# Patient Record
Sex: Female | Born: 1990 | Race: White | Hispanic: No | Marital: Single | Smoking: Never smoker
Health system: Southern US, Community
[De-identification: ages and names within clinical notes are randomized; demographics above are authoritative.]

## PROBLEM LIST (undated history)

## (undated) DIAGNOSIS — A749 Chlamydial infection, unspecified: Secondary | ICD-10-CM

## (undated) HISTORY — DX: Chlamydial infection, unspecified: A74.9

---
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
## 2010-02-27 ENCOUNTER — Emergency Department (HOSPITAL_COMMUNITY): Admission: EM | Admit: 2010-02-27 | Discharge: 2010-02-27 | Payer: Self-pay | Admitting: Emergency Medicine

## 2011-01-24 ENCOUNTER — Inpatient Hospital Stay (INDEPENDENT_AMBULATORY_CARE_PROVIDER_SITE_OTHER)
Admission: RE | Admit: 2011-01-24 | Discharge: 2011-01-24 | Disposition: A | Discharge status: Home / Self Care | Payer: BC Managed Care – PPO | Source: Ambulatory Visit | Attending: Family Medicine | Admitting: Family Medicine

## 2011-01-24 ENCOUNTER — Ambulatory Visit (INDEPENDENT_AMBULATORY_CARE_PROVIDER_SITE_OTHER): Payer: BC Managed Care – PPO

## 2011-01-24 DIAGNOSIS — M25579 Pain in unspecified ankle and joints of unspecified foot: Secondary | ICD-10-CM

## 2011-01-24 DIAGNOSIS — H6980 Other specified disorders of Eustachian tube, unspecified ear: Secondary | ICD-10-CM

## 2012-08-02 IMAGING — CR DG ANKLE COMPLETE 3+V*L*
3 series · 3 of 3 positions shown · non-contrast
Comparison: None.

CLINICAL DATA: Pain after jumping

LEFT ANKLE COMPLETE - 3+ VIEW

[view not recorded (1 of 3)]
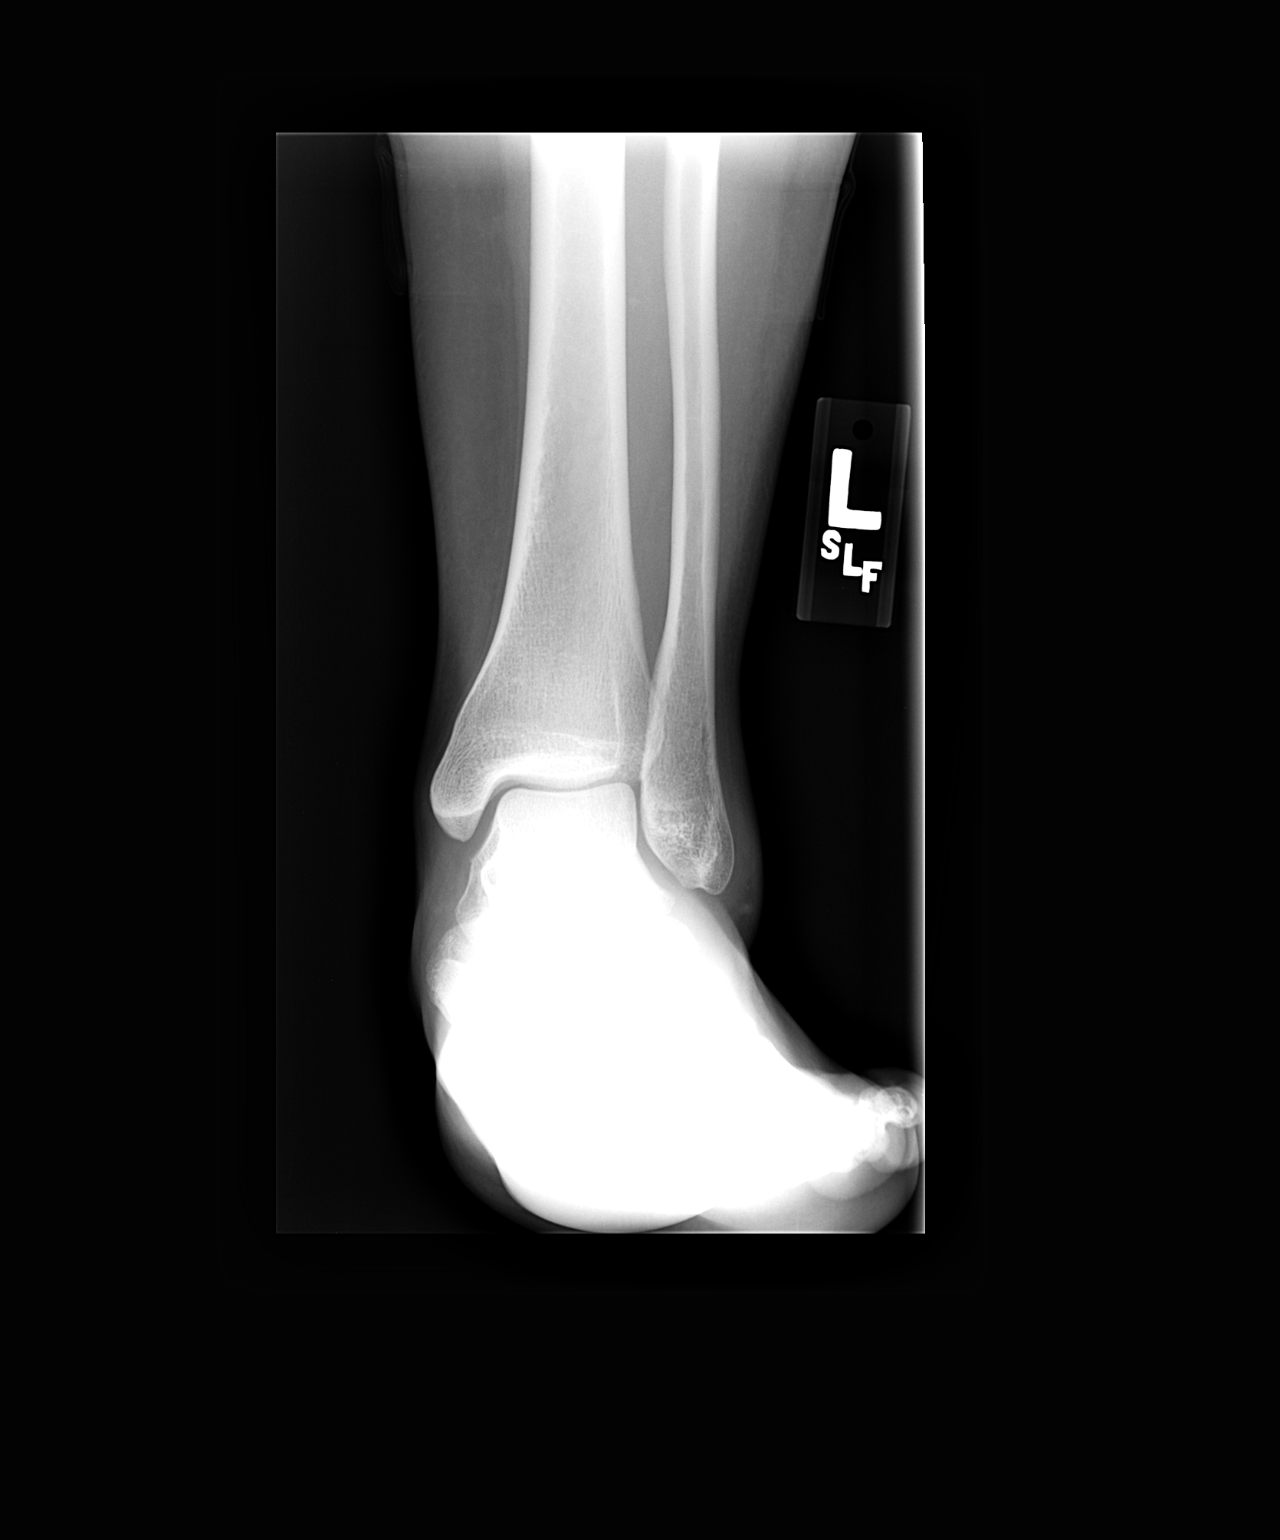

[view not recorded (2 of 3)]
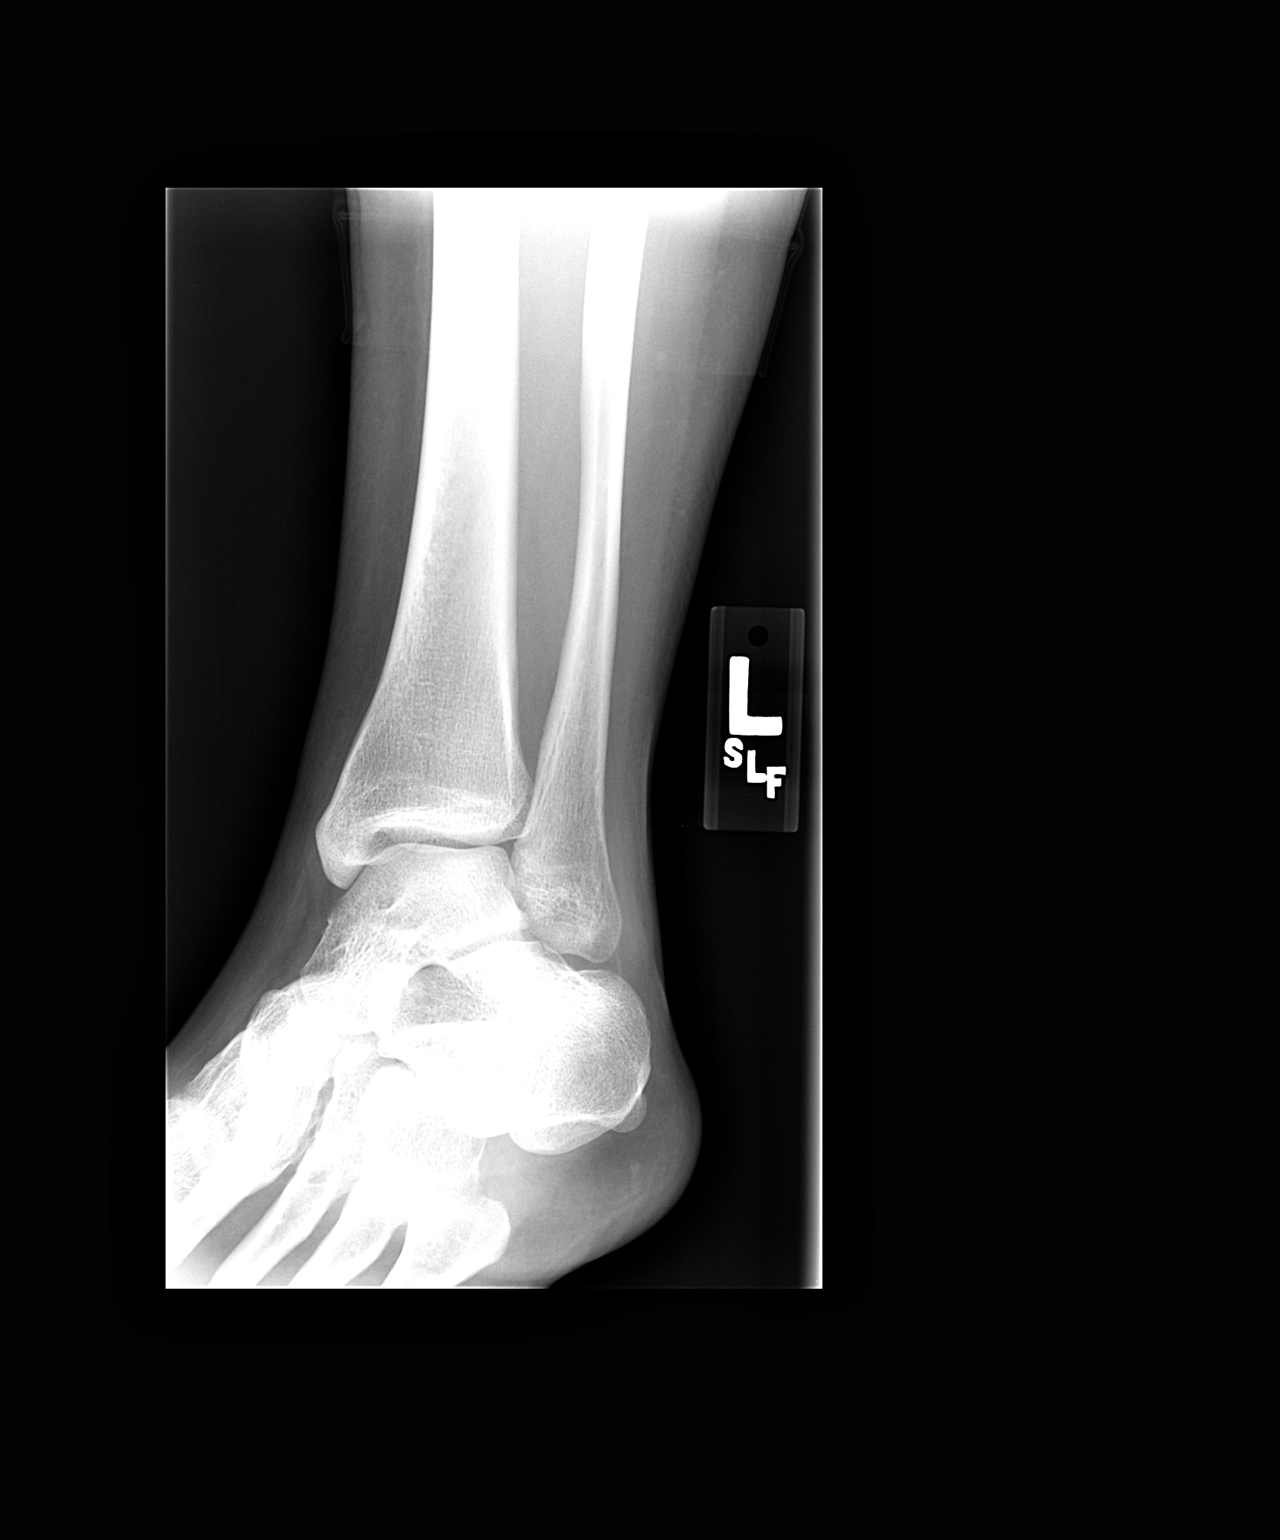

[view not recorded (3 of 3)]
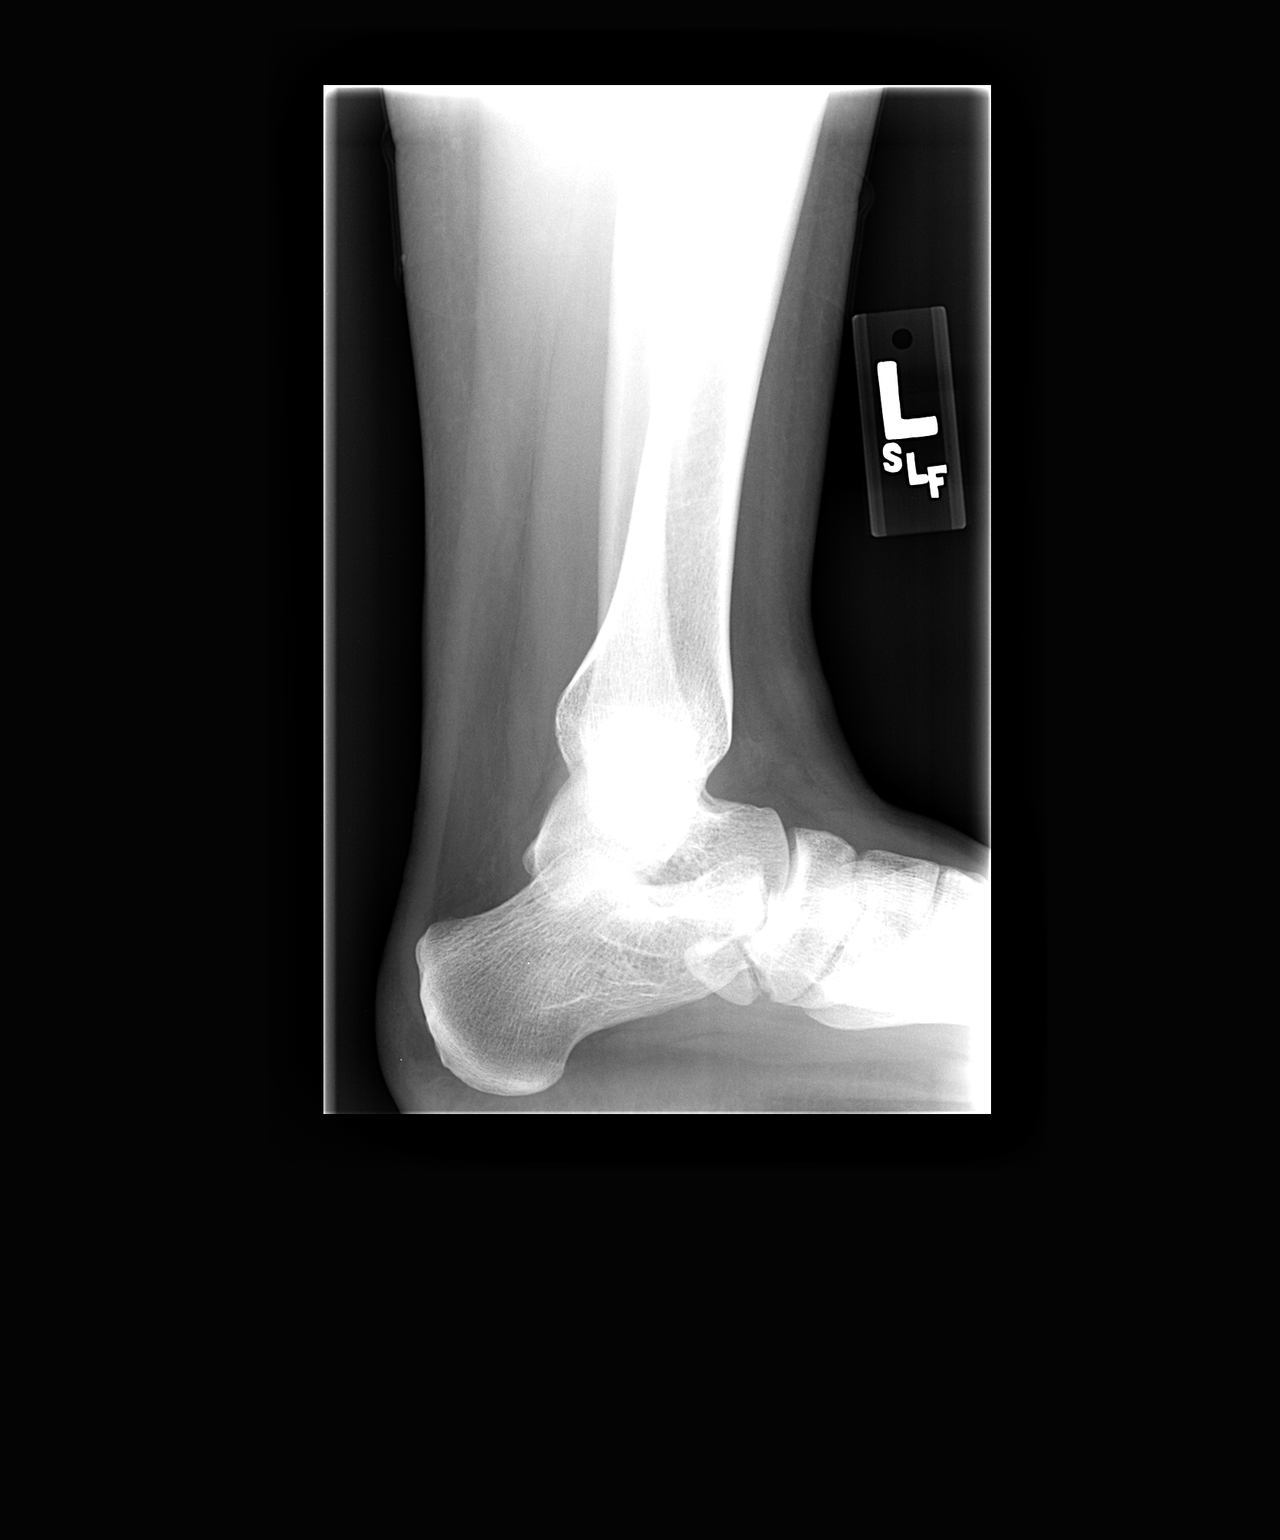

[3 of 3 positions shown; findings below may reference images not displayed]

FINDINGS: Ankle mortise intact. Negative for fracture, dislocation,
or other acute abnormality.  Normal alignment and mineralization.
No significant degenerative change.  Regional soft tissues
unremarkable.
IMPRESSION: Negative

## 2016-04-12 ENCOUNTER — Ambulatory Visit (INDEPENDENT_AMBULATORY_CARE_PROVIDER_SITE_OTHER): Payer: BLUE CROSS/BLUE SHIELD | Admitting: Gynecology

## 2016-04-12 ENCOUNTER — Ambulatory Visit: Payer: Self-pay | Admitting: Gynecology

## 2016-04-12 ENCOUNTER — Encounter: Payer: Self-pay | Admitting: Gynecology

## 2016-04-12 VITALS — BP 120/76

## 2016-04-12 DIAGNOSIS — Z124 Encounter for screening for malignant neoplasm of cervix: Secondary | ICD-10-CM

## 2016-04-12 DIAGNOSIS — Z832 Family history of diseases of the blood and blood-forming organs and certain disorders involving the immune mechanism: Secondary | ICD-10-CM

## 2016-04-12 DIAGNOSIS — Z3009 Encounter for other general counseling and advice on contraception: Secondary | ICD-10-CM | POA: Diagnosis not present

## 2016-04-12 DIAGNOSIS — L731 Pseudofolliculitis barbae: Secondary | ICD-10-CM | POA: Diagnosis not present

## 2016-04-12 DIAGNOSIS — Z113 Encounter for screening for infections with a predominantly sexual mode of transmission: Secondary | ICD-10-CM | POA: Diagnosis not present

## 2016-04-12 DIAGNOSIS — L739 Follicular disorder, unspecified: Secondary | ICD-10-CM | POA: Insufficient documentation

## 2016-04-12 LAB — PROTIME-INR
INR: 0.93 (ref <1.50)
Prothrombin Time: 12.6 seconds (ref 11.6–15.2)

## 2016-04-12 LAB — APTT: APTT: 28 s (ref 24–37)

## 2016-04-12 LAB — HIV ANTIBODY (ROUTINE TESTING W REFLEX): HIV: NONREACTIVE

## 2016-04-12 MED ORDER — ETONOGESTREL-ETHINYL ESTRADIOL 0.12-0.015 MG/24HR VA RING: VAGINAL_RING | VAGINAL | Status: DC

## 2016-04-12 MED ORDER — DOXYCYCLINE HYCLATE 100 MG PO CAPS: 100.0000 mg | ORAL_CAPSULE | Freq: Two times a day (BID) | ORAL | Status: DC

## 2016-04-12 MED ORDER — DROSPIRENONE-ETHINYL ESTRADIOL 3-0.03 MG PO TABS: 1.0000 | ORAL_TABLET | Freq: Every day | ORAL | Status: AC

## 2016-04-12 MED ORDER — MUPIROCIN 2 % EX OINT: 1.0000 "application " | TOPICAL_OINTMENT | Freq: Three times a day (TID) | CUTANEOUS | Status: AC

## 2016-04-12 NOTE — Progress Notes (Signed)
HPI: Patient is a 25 year old gravida 0 who was referred to her practice as a courtesy of Tomi Bamberger NP in reference to a vulvar lesion the patient notices for the past 2 weeks. Patient has been with the same sexual partner for 2 years. She has been using condoms for contraception but only used the NuvaRing at one time for one month only. She was interested in discussing as well other contraceptive options. She did mention that her sister in her mid 18s was diagnosed with some form of blood clot in her lower extremity but is not clear on the details. Patient stated her last Pap smear was over 5 years ago.   ROS: A ROS was performed and pertinent positives and negatives are included in the history.  GENERAL: No fevers or chills. HEENT: No change in vision, no earache, sore throat or sinus congestion. NECK: No pain or stiffness. CARDIOVASCULAR: No chest pain or pressure. No palpitations. PULMONARY: No shortness of breath, cough or wheeze. GASTROINTESTINAL: No abdominal pain, nausea, vomiting or diarrhea, melena or bright red blood per rectum. GENITOURINARY: No urinary frequency, urgency, hesitancy or dysuria. MUSCULOSKELETAL: No joint or muscle pain, no back pain, no recent trauma. DERMATOLOGIC: No rash, no itching, no lesions. ENDOCRINE: No polyuria, polydipsia, no heat or cold intolerance. No recent change in weight. HEMATOLOGICAL: No anemia or easy bruising or bleeding. NEUROLOGIC: No headache, seizures, numbness, tingling or weakness. PSYCHIATRIC: No depression, no loss of interest in normal activity or change in sleep pattern.   ZO:XWRU developed well nourished female with the above complaint. Physical Exam  Genitourinary:     This area was highly suspicious for a draining sebaceous cysts. Culture was obtained. Also a GC and Chlamydia culture was obtained as well from the cervix.    Assessment Plan: Weight appears to be a small follicular abscess starting to drain. She will be started on  Vibramycin 100 mg one by mouth twice a day for 2 weeks to cover for possible MRSA and she will apply Bactroban to the area 2-3 times a day as well as use antibacterial soap. If it does not resolve in 2 weeks she'll return back to the office we will need to consider doing an incision and drainage or if he gets bigger or more painful. Patient was interested in going on the oral contraceptive pill not only to regulate her cycle but for her acne but when she brought up the history of her sister with what sounds to be a DVT I would like to run a thrombophilia panel to rule out any clotting disorders before starting her oral contraceptive pill. The following labs were ordered: PT, PTT, fibrinogen, anti-thrombin 3, total protein C, total protein S, lupus anticoagulant panel, and Leiden 5 factor, cardiolipin antibody and prothrombin gene mutation. Also as part of her STD screening in addition to the [facility] and Chlamydia culture she will stop by the lab we'll check an HIV as well. If all these results are normal she is going to be started on Ocela oral contraceptive pill. The risks benefits and pros and cons were discussed and literature information was provided. She is going to call back in a week for the results. She is not due for her next menstrual cycle for 3 weeks from now. We also did a Pap smear since it been 5 years since her last Pap smear.    Greater than 50% of time was spent in counseling and coordinating care of this patient.   Time of  consultation: 30   Minutes.

## 2016-04-12 NOTE — Patient Instructions (Addendum)
Drospirenone; Ethinyl Estradiol tablets What is this medicine? DROSPIRENONE; ETHINYL ESTRADIOL (dro SPY re nown; ETH in il es tra DYE ole) is an oral contraceptive (birth control pill). This medicine combines two types of female hormones, an estrogen and a progestin. It is used to prevent ovulation and pregnancy. This medicine may be used for other purposes; ask your health care provider or pharmacist if you have questions. What should I tell my health care provider before I take this medicine? They need to know if you have or ever had any of these conditions: -abnormal vaginal bleeding -adrenal gland disease -blood vessel disease or blood clots -breast, cervical, endometrial, ovarian, liver, or uterine cancer -diabetes -gallbladder disease -heart disease or recent heart attack -high blood pressure -high cholesterol -high potassium level -kidney disease -liver disease -migraine headaches -stroke -systemic lupus erythematosus (SLE) -tobacco smoker -an unusual or allergic reaction to estrogens, progestins, or other medicines, foods, dyes, or preservatives -pregnant or trying to get pregnant -breast-feeding How should I use this medicine? Take this medicine by mouth. To reduce nausea, this medicine may be taken with food. Follow the directions on the prescription label. Take this medicine at the same time each day and in the order directed on the package. Do not take your medicine more often than directed. A patient package insert for the product will be given with each prescription and refill. Read this sheet carefully each time. The sheet may change frequently. Talk to your pediatrician regarding the use of this medicine in children. Special care may be needed. This medicine has been used in female children who have started having menstrual periods. Overdosage: If you think you have taken too much of this medicine contact a poison control center or emergency room at once. NOTE: This  medicine is only for you. Do not share this medicine with others. What if I miss a dose? If you miss a dose, refer to the patient information sheet you received with your medicine for direction. If you miss more than one pill, this medicine may not be as effective and you may need to use another form of birth control. What may interact with this medicine? -acetaminophen -antibiotics or medicines for infections, especially rifampin, rifabutin, rifapentine, and griseofulvin, and possibly penicillins or tetracyclines -aprepitant -ascorbic acid (vitamin C) -atorvastatin -barbiturate medicines, such as phenobarbital -bosentan -carbamazepine -caffeine -clofibrate -cyclosporine -dantrolene -doxercalciferol -felbamate -grapefruit juice -hydrocortisone -medicines for anxiety or sleeping problems, such as diazepam or temazepam -medicines for diabetes, including pioglitazone -mineral oil -modafinil -mycophenolate -nefazodone -oxcarbazepine -phenytoin -prednisolone -ritonavir or other medicines for HIV infection or AIDS -rosuvastatin -selegiline -soy isoflavones supplements -St. John's wort -tamoxifen or raloxifene -theophylline -thyroid hormones -topiramate -warfarin This product is different from other birth control pills because it contains the progestin drospirenone. Drospirenone may increase potassium levels. Interactions with other drugs may increase the chance of an elevated potassium level. You may need blood tests to check your potassium level. Drugs that can increase the potassium level include: -certain medications for high blood pressure or heart conditions (examples include ACE-inhibitors and also Angiotensin-II receptor blockers, and Eplerenone -dietary salt substitutes (these may contain potassium) -heparin -NSAIDs (antiinflammatory drugs), if they are taken long-term and daily, like for arthritis -potassium supplements -some 'water pills' (diuretics like amiloride,  spironolactone or triamterene) This list may not describe all possible interactions. Give your health care provider a list of all the medicines, herbs, non-prescription drugs, or dietary supplements you use. Also tell them if you smoke, drink alcohol, or use illegal   drugs. Some items may interact with your medicine. What should I watch for while using this medicine? Visit your doctor or health care professional for regular checks on your progress. You will need a regular breast and pelvic exam and Pap smear while on this medicine. Use an additional method of contraception during the first cycle that you take these tablets. If you have any reason to think you are pregnant, stop taking this medicine right away and contact your doctor or health care professional. If you are taking this medicine for hormone related problems, it may take several cycles of use to see improvement in your condition. Smoking increases the risk of getting a blood clot or having a stroke while you are taking birth control pills, especially if you are more than 26 years old. You are strongly advised not to smoke. This medicine can make your body retain fluid, making your fingers, hands, or ankles swell. Your blood pressure can go up. Contact your doctor or health care professional if you feel you are retaining fluid. This medicine can make you more sensitive to the sun. Keep out of the sun. If you cannot avoid being in the sun, wear protective clothing and use sunscreen. Do not use sun lamps or tanning beds/booths. If you wear contact lenses and notice visual changes, or if the lenses begin to feel uncomfortable, consult your eye care specialist. In some women, tenderness, swelling, or minor bleeding of the gums may occur. Notify your dentist if this happens. Brushing and flossing your teeth regularly may help limit this. See your dentist regularly and inform your dentist of the medicines you are taking. If you are going to have  elective surgery, you may need to stop taking this medicine before the surgery. Consult your health care professional for advice. This medicine does not protect you against HIV infection (AIDS) or any other sexually transmitted diseases. What side effects may I notice from receiving this medicine? Side effects that you should report to your doctor or health care professional as soon as possible: -allergic reactions like skin rash, itching or hives, swelling of the face, lips, or tongue -breast tissue changes or discharge -changes in vision -chest pain -confusion, trouble speaking or understanding -dark urine -general ill feeling or flu-like symptoms -light-colored stools -nausea, vomiting -pain, swelling, warmth in the leg -right upper belly pain -severe headaches -shortness of breath -sudden numbness or weakness of the face, arm or leg -trouble walking, dizziness, loss of balance or coordination -unusual vaginal bleeding -yellowing of the eyes or skin Side effects that usually do not require medical attention (report to your doctor or health care professional if they continue or are bothersome): -acne -brown spots on the face -change in appetite -change in sexual desire -depressed mood or mood swings -fluid retention and swelling -stomach cramps or bloating -unusually weak or tired -weight gain This list may not describe all possible side effects. Call your doctor for medical advice about side effects. You may report side effects to FDA at 1-800-FDA-1088. Where should I keep my medicine? Keep out of the reach of children. Store at room temperature between 15 and 30 degrees C (59 and 86 degrees F). Throw away any unused medicine after the expiration date. NOTE: This sheet is a summary. It may not cover all possible information. If you have questions about this medicine, talk to your doctor, pharmacist, or health care provider.    2016, Elsevier/Gold Standard. (2008-11-03  13:02:54)  Folliculitis Folliculitis is redness, soreness, and swelling (  inflammation) of the hair follicles. This condition can occur anywhere on the body. People with weakened immune systems, diabetes, or obesity have a greater risk of getting folliculitis. CAUSES  Bacterial infection. This is the most common cause.  Fungal infection.  Viral infection.  Contact with certain chemicals, especially oils and tars. Long-term folliculitis can result from bacteria that live in the nostrils. The bacteria may trigger multiple outbreaks of folliculitis over time. SYMPTOMS Folliculitis most commonly occurs on the scalp, thighs, legs, back, buttocks, and areas where hair is shaved frequently. An early sign of folliculitis is a small, white or yellow, pus-filled, itchy lesion (pustule). These lesions appear on a red, inflamed follicle. They are usually less than 0.2 inches (5 mm) wide. When there is an infection of the follicle that goes deeper, it becomes a boil or furuncle. A group of closely packed boils creates a larger lesion (carbuncle). Carbuncles tend to occur in hairy, sweaty areas of the body. DIAGNOSIS  Your caregiver can usually tell what is wrong by doing a physical exam. A sample may be taken from one of the lesions and tested in a lab. This can help determine what is causing your folliculitis. TREATMENT  Treatment may include:  Applying warm compresses to the affected areas.  Taking antibiotic medicines orally or applying them to the skin.  Draining the lesions if they contain a large amount of pus or fluid.  Laser hair removal for cases of long-lasting folliculitis. This helps to prevent regrowth of the hair. HOME CARE INSTRUCTIONS  Apply warm compresses to the affected areas as directed by your caregiver.  If antibiotics are prescribed, take them as directed. Finish them even if you start to feel better.  You may take over-the-counter medicines to relieve itching.  Do not  shave irritated skin.  Follow up with your caregiver as directed. SEEK IMMEDIATE MEDICAL CARE IF:   You have increasing redness, swelling, or pain in the affected area.  You have a fever. MAKE SURE YOU:  Understand these instructions.  Will watch your condition.  Will get help right away if you are not doing well or get worse.   This information is not intended to replace advice given to you by your health care provider. Make sure you discuss any questions you have with your health care provider.   Document Released: 01/27/2002 Document Revised: 12/09/2014 Document Reviewed: 02/18/2012 Elsevier Interactive Patient Education [employer]2016 Elsevier Inc.

## 2016-04-13 LAB — FIBRINOGEN: FIBRINOGEN: 208 mg/dL (ref 204–475)

## 2016-04-13 LAB — GC/CHLAMYDIA PROBE AMP
CT Probe RNA: DETECTED — AB
GC Probe RNA: NOT DETECTED

## 2016-04-15 ENCOUNTER — Encounter: Payer: Self-pay | Admitting: Gynecology

## 2016-04-15 LAB — PAP IG W/ RFLX HPV ASCU

## 2016-04-15 LAB — WOUND CULTURE
GRAM STAIN: NONE SEEN
GRAM STAIN: NONE SEEN
Gram Stain: NONE SEEN

## 2016-04-16 LAB — CARDIOLIPIN ANTIBODY: PHOSPHOLIPIDS: 232 mg/dL (ref 151–264)

## 2016-04-17 ENCOUNTER — Telehealth: Payer: Self-pay | Admitting: *Deleted

## 2016-04-17 LAB — RFX DRVVT SCR W/RFLX CONF 1:1 MIX: dRVVT Screen: 36 s (ref <=45)

## 2016-04-17 LAB — PROTHROMBIN GENE MUTATION

## 2016-04-17 LAB — FACTOR 5 LEIDEN

## 2016-04-17 LAB — RFX PTT-LA W/RFX TO HEX PHASE CONF: PTT-LA SCREEN: 42 s — AB (ref <=40)

## 2016-04-17 LAB — RFLX HEXAGONAL PHASE CONFIRM: Hexagonal Phase Confirm: NEGATIVE

## 2016-04-17 LAB — LUPUS ANTICOAGULANT PANEL

## 2016-04-17 NOTE — Telephone Encounter (Signed)
Dr.Fernandez pt had positive Chlamydia results 04/15/16, told to take Vibramycin 100 mg one by mouth twice a day for 7 days, you send Rx to pharmacy for this same Rx on OV 04/12/16. I just want to confirm that pt doesn't need another Rx? And this one will take care of this.  Please advise

## 2016-04-17 NOTE — Telephone Encounter (Signed)
She was put on Vibramycin the other day for 2 weeks for potential MRSA so she does not need any additional antibiotic this will cover for her chlamydia

## 2016-04-18 NOTE — Telephone Encounter (Signed)
This was explained to pt yesterday, I just wanted to confirm this with provider

## 2016-04-19 LAB — ANTITHROMBIN III: AntiThromb III Func: 103 % activity (ref 80–120)

## 2016-04-19 LAB — PROTEIN C, TOTAL: Protein C Antigen: 103 % (ref 70–140)

## 2016-04-19 LAB — PROTEIN S, TOTAL: PROTEIN S ANTIGEN, TOTAL: 96 % (ref 70–140)

## 2016-05-02 ENCOUNTER — Ambulatory Visit: Payer: Self-pay | Admitting: Gynecology

## 2017-02-11 ENCOUNTER — Ambulatory Visit: Payer: Self-pay | Admitting: Gynecology

## 2017-02-11 DIAGNOSIS — Z0289 Encounter for other administrative examinations: Secondary | ICD-10-CM

## 2017-04-16 ENCOUNTER — Encounter: Payer: Self-pay | Admitting: Gynecology

## 2019-10-25 ENCOUNTER — Other Ambulatory Visit: Payer: Self-pay

## 2019-10-25 ENCOUNTER — Encounter (HOSPITAL_COMMUNITY): Payer: Self-pay

## 2019-10-25 ENCOUNTER — Ambulatory Visit (HOSPITAL_COMMUNITY)
Admission: EM | Admit: 2019-10-25 | Discharge: 2019-10-25 | Disposition: A | Discharge status: Home / Self Care | Payer: 59 | Attending: Family Medicine | Admitting: Family Medicine

## 2019-10-25 DIAGNOSIS — Z20822 Contact with and (suspected) exposure to covid-19: Secondary | ICD-10-CM

## 2019-10-25 DIAGNOSIS — Z20828 Contact with and (suspected) exposure to other viral communicable diseases: Secondary | ICD-10-CM | POA: Diagnosis not present

## 2019-10-25 NOTE — ED Provider Notes (Signed)
[facility]    CSN: [id number] Arrival date & time: 10/25/19  1819      History   Chief Complaint Chief Complaint  Patient presents with  . covid exp/ no sxs    HPI [place] is a 28 y.o. female.   [place] Paschal-Landin presents with requests for covid testing. She was at a dinner 8 days ago, found out today that someone else at the dinner tested positive for covid-19 yesterday. She has no symptoms of covid-19 and feels well. She works from home. She has no complaints at this time.    ROS per HPI, negative if not otherwise mentioned.      Past Medical History:  Diagnosis Date  . Chlamydia     Patient Active Problem List   Diagnosis Date Noted  . Folliculitis of perineum 04/12/2016    History reviewed. No pertinent surgical history.  OB History    Gravida  0   Para  0   Term  0   Preterm  0   AB  0   Living  0     SAB  0   TAB  0   Ectopic  0   Multiple  0   Live Births               Home Medications    Prior to Admission medications   Medication Sig Start Date End Date Taking? Authorizing Provider  doxycycline (VIBRAMYCIN) 100 MG capsule Take 1 capsule (100 mg total) by mouth 2 (two) times daily. Take one tablet twice a day for 1 week 04/12/16   Ok Edwards, MD  drospirenone-ethinyl estradiol (YASMIN,ZARAH,SYEDA) 3-0.03 MG tablet Take 1 tablet by mouth daily. 04/12/16   Ok Edwards, MD  Multiple Vitamin (MULTIVITAMIN) tablet Take 1 tablet by mouth daily.    [provider]  mupirocin ointment (BACTROBAN) 2 % Apply 1 application topically 3 (three) times daily. 04/12/16   Ok Edwards, MD    Family History Family History  Problem Relation Age of Onset  . Diabetes Father   . Healthy Mother     Social History Social History   Tobacco Use  . Smoking status: Never Smoker  . Smokeless tobacco: Never Used  Substance Use Topics  . Alcohol use: Yes    Alcohol/week: 0.0 standard  drinks    Comment: RARE  . Drug use: Not on file     Allergies   Patient has no known allergies.   Review of Systems Review of Systems   Physical Exam Triage Vital Signs ED Triage Vitals  Enc Vitals Group     BP 10/25/19 1915 135/73     Pulse Rate 10/25/19 1915 76     Resp 10/25/19 1915 16     Temp 10/25/19 1915 99 F (37.2 C)     Temp Source 10/25/19 1915 Oral     SpO2 10/25/19 1915 100 %     Weight --      Height --      Head Circumference --      Peak Flow --      Pain Score 10/25/19 1918 0     Pain Loc --      Pain Edu? --      Excl. in GC? --    No data found.  Updated Vital Signs BP 135/73 (BP Location: Left Arm)   Pulse 76   Temp 99 F (37.2 C) (Oral)   Resp 16   SpO2  100%   Visual Acuity Right Eye Distance:   Left Eye Distance:   Bilateral Distance:    Right Eye Near:   Left Eye Near:    Bilateral Near:     Physical Exam Constitutional:      General: She is not in acute distress.    Appearance: She is well-developed.  Cardiovascular:     Rate and Rhythm: Normal rate.  Pulmonary:     Effort: Pulmonary effort is normal.  Skin:    General: Skin is warm and dry.  Neurological:     Mental Status: She is alert and oriented to person, place, and time.      UC Treatments / Results  Labs (all labs ordered are listed, but only abnormal results are displayed) Labs Reviewed  NOVEL CORONAVIRUS, NAA (HOSP ORDER, SEND-OUT TO REF LAB; TAT 18-24 HRS)    EKG   Radiology No results found.  Procedures Procedures (including critical care time)  Medications Ordered in UC Medications - No data to display  Initial Impression / Assessment and Plan / UC Course  I have reviewed the triage vital signs and the nursing notes.  Pertinent labs & imaging results that were available during my care of the patient were reviewed by me and considered in my medical decision making (see chart for details).     covid testing collected and pending. No  acute complaints at this time. Isolation and precautions discussed. Return precautions provided. Patient verbalized understanding and agreeable to plan.    Final Clinical Impressions(s) / UC Diagnoses   Final diagnoses:  Exposure to COVID-19 virus  Encounter for laboratory testing for COVID-19 virus     Discharge Instructions     Self isolate until covid results are back and negative.  Will notify you by phone of any positive findings. Your negative results will be sent through your MyChart.     There is risk of false negative if tested too early, you are at risk for symptoms for 14 days following exposure, if you develop symptoms please isolate.    ED Prescriptions    None     PDMP not reviewed this encounter.   Zigmund Gottron, NP 10/25/19 1946

## 2019-10-25 NOTE — Discharge Instructions (Signed)
Self isolate until covid results are back and negative.  °Will notify you by phone of any positive findings. Your negative results will be sent through your MyChart.     °There is risk of false negative if tested too early, you are at risk for symptoms for 14 days following exposure, if you develop symptoms please isolate.   °

## 2019-10-25 NOTE — ED Triage Notes (Signed)
Pt presents to UC stating she had a positive covid exposure 1 week ago. Pt does not have any symptoms at this time.

## 2019-10-28 LAB — NOVEL CORONAVIRUS, NAA (HOSP ORDER, SEND-OUT TO REF LAB; TAT 18-24 HRS): SARS-CoV-2, NAA: NOT DETECTED

## 2020-12-04 ENCOUNTER — Ambulatory Visit: Payer: 59 | Admitting: Physician Assistant

## 2023-03-28 ENCOUNTER — Telehealth: Payer: 59 | Admitting: Family Medicine

## 2023-03-28 DIAGNOSIS — L709 Acne, unspecified: Secondary | ICD-10-CM | POA: Diagnosis not present

## 2023-03-28 MED ORDER — TRETINOIN 0.025 % EX CREA: TOPICAL_CREAM | Freq: Every day | CUTANEOUS | 0 refills | Status: AC

## 2023-03-28 NOTE — Progress Notes (Signed)
E-Visit for Acne  We are sorry that you are experiencing this issue.  Here is how we plan to help!  Based on what you shared with me it looks like you have uncomplicated acne.  Acne is a disorder of the hair follicles and oil glands (sebaceous glands). The sebaceous glands secrete oils to keep the skin moist.  When the glands get clogged, it can lead to pimples or cysts.  These cysts may become infected and leave scars. Acne is very common and normally occurs at puberty.  Acne is also inherited.  Your personal care plan consists of the following recommendations:  I recommend that you use a daily cleanser  You might try an over the counter cleanser that has benzoyl peroxide.  I recommend that you start with a product that has 2.5% benzoyl peroxide.  Stronger concentrations have not been shown to be more effective.   I have also prescribed one of the following additional therapies:  Tretinoin  If excessive dryness or peeling occurs, reduce dose frequency or concentration of the topical scrubs.  If excessive stinging or burning occurs, remove the topical gel with mild soap and water and resume at a lower dose the next day.  Remember oral antibiotics and topical acne treatments may increase your sensitivity to the sun!  HOME CARE: Do not squeeze pimples because that can often lead to infections, worse acne, and scars. Use a moisturizer that contains retinoid or fruit acids that may inhibit the development of new acne lesions. Although there is not a clear link that foods can cause acne, doctors do believe that too many sweets predispose you to skin problems.  GET HELP RIGHT AWAY IF: If your acne gets worse or is not better within 10 days. If you become depressed. If you become pregnant, discontinue medications and call your OB/GYN.  MAKE SURE YOU: Understand these instructions. Will watch your condition. Will get help right away if you are not doing well or get worse.  Thank you for  choosing an e-visit.  Your e-visit answers were reviewed by a board certified advanced clinical practitioner to complete your personal care plan. Depending upon the condition, your plan could have included both over the counter or prescription medications.  Please review your pharmacy choice. Make sure the pharmacy is open so you can pick up prescription now. If there is a problem, you may contact your provider through [employer] and have the prescription routed to another pharmacy.  Your safety is important to [place]. If you have drug allergies check your prescription carefully.   For the next 24 hours you can use MyChart to ask questions about today's visit, request a non-urgent call back, or ask for a work or school excuse. You will get an email in the next two days asking about your experience. I hope that your e-visit has been valuable and will speed your recovery.   have provided 5 minutes of non face to face time during this encounter for chart review and documentation.

## 2023-04-01 MED ORDER — DOXYCYCLINE HYCLATE 100 MG PO TABS: 100.0000 mg | ORAL_TABLET | Freq: Two times a day (BID) | ORAL | 0 refills | Status: AC

## 2023-04-01 NOTE — Addendum Note (Signed)
Addended by: Jannifer Rodney A on: 04/01/2023 05:08 PM   Modules accepted: Orders

## 2023-05-29 ENCOUNTER — Telehealth: Payer: 59 | Admitting: Physician Assistant

## 2023-05-29 DIAGNOSIS — S90463A Insect bite (nonvenomous), unspecified great toe, initial encounter: Secondary | ICD-10-CM

## 2023-05-29 DIAGNOSIS — W57XXXA Bitten or stung by nonvenomous insect and other nonvenomous arthropods, initial encounter: Secondary | ICD-10-CM

## 2023-05-30 ENCOUNTER — Encounter (INDEPENDENT_AMBULATORY_CARE_PROVIDER_SITE_OTHER): Payer: Self-pay

## 2023-05-30 MED ORDER — CEPHALEXIN 500 MG PO CAPS: 500.0000 mg | ORAL_CAPSULE | Freq: Four times a day (QID) | ORAL | 0 refills | Status: AC

## 2023-05-30 NOTE — Progress Notes (Signed)
E Visit for Cellulitis  We are sorry that you are not feeling well. Here is how we plan to help!  Based on what you shared with me it looks like you have cellulitis secondary to an insect bite. Avoid popping the pustule present. If it drains on its own that is fine. Apply warm compresses to promote drainage. I have prescribed:  Keflex 500mg  take one by mouth four times a day for 5 days. If symptoms are not turning the corner over the next 48-72 hours or anything continues to progress, you need an in-person evaluation ASAP.  HOME CARE:  Take your medications as ordered and take all of them, even if the skin irritation appears to be healing.   GET HELP RIGHT AWAY IF:  Symptoms that don't begin to go away within 48 hours. Severe redness persists or worsens If the area turns color, spreads or swells. If it blisters and opens, develops yellow-brown crust or bleeds. You develop a fever or chills. If the pain increases or becomes unbearable.  Are unable to keep fluids and food down.  MAKE SURE YOU   Understand these instructions. Will watch your condition. Will get help right away if you are not doing well or get worse.  Thank you for choosing an e-visit.  Your e-visit answers were reviewed by a board certified advanced clinical practitioner to complete your personal care plan. Depending upon the condition, your plan could have included both over the counter or prescription medications.  Please review your pharmacy choice. Make sure the pharmacy is open so you can pick up prescription now. If there is a problem, you may contact your provider through [employer] and have the prescription routed to another pharmacy.  Your safety is important to [place]. If you have drug allergies check your prescription carefully.   For the next 24 hours you can use MyChart to ask questions about today's visit, request a non-urgent call back, or ask for a work or school excuse. You will get an email in the  next two days asking about your experience. I hope that your e-visit has been valuable and will speed your recovery.

## 2023-05-30 NOTE — Progress Notes (Signed)
I have spent 5 minutes in review of e-visit questionnaire, review and updating patient chart, medical decision making and response to patient.   Chantil Bari Cody Osie Amparo, PA-C    

## 2023-08-01 ENCOUNTER — Telehealth: Payer: 59 | Admitting: Family Medicine

## 2023-08-01 DIAGNOSIS — R21 Rash and other nonspecific skin eruption: Secondary | ICD-10-CM

## 2023-08-01 MED ORDER — PREDNISONE 20 MG PO TABS: 20.0000 mg | ORAL_TABLET | Freq: Two times a day (BID) | ORAL | 0 refills | Status: AC

## 2023-08-01 NOTE — Progress Notes (Signed)
E Visit for Rash  We are sorry that you are not feeling well. Here is how we plan to help!  Based on what you shared with me it looks like you have contact dermatitis.  Contact dermatitis is a skin rash caused by something that touches the skin and causes irritation or inflammation.  Your skin may be red, swollen, dry, cracked, and itch.  The rash should go away in a few days but can last a few weeks.  If you get a rash, it's important to figure out what caused it so the irritant can be avoided in the future.   I am sending a 6 day prednisone pack to take as directed.    HOME CARE:  Take cool showers and avoid direct sunlight. Apply cool compress or wet dressings. Take a bath in an oatmeal bath.  Sprinkle content of one Aveeno packet under running faucet with comfortably warm water.  Bathe for 15-20 minutes, 1-2 times daily.  Pat dry with a towel. Do not rub the rash. Use hydrocortisone cream. Take an antihistamine like Benadryl for widespread rashes that itch.  The adult dose of Benadryl is 25-50 mg by mouth 4 times daily. Caution:  This type of medication may cause sleepiness.  Do not drink alcohol, drive, or operate dangerous machinery while taking antihistamines.  Do not take these medications if you have prostate enlargement.  Read package instructions thoroughly on all medications that you take.  GET HELP RIGHT AWAY IF:  Symptoms don't go away after treatment. Severe itching that persists. If you rash spreads or swells. If you rash begins to smell. If it blisters and opens or develops a yellow-brown crust. You develop a fever. You have a sore throat. You become short of breath.  MAKE SURE YOU:  Understand these instructions. Will watch your condition. Will get help right away if you are not doing well or get worse.  Thank you for choosing an e-visit.  Your e-visit answers were reviewed by a board certified advanced clinical practitioner to complete your personal care plan.  Depending upon the condition, your plan could have included both over the counter or prescription medications.  Please review your pharmacy choice. Make sure the pharmacy is open so you can pick up prescription now. If there is a problem, you may contact your provider through [employer] and have the prescription routed to another pharmacy.  Your safety is important to [place]. If you have drug allergies check your prescription carefully.   For the next 24 hours you can use MyChart to ask questions about today's visit, request a non-urgent call back, or ask for a work or school excuse. You will get an email in the next two days asking about your experience. I hope that your e-visit has been valuable and will speed your recovery.    have provided 5 minutes of non face to face time during this encounter for chart review and documentation.

## 2023-08-10 ENCOUNTER — Telehealth: Payer: 59 | Admitting: Physician Assistant

## 2023-08-10 DIAGNOSIS — R21 Rash and other nonspecific skin eruption: Secondary | ICD-10-CM

## 2023-08-10 MED ORDER — PREDNISONE 20 MG PO TABS: 40.0000 mg | ORAL_TABLET | Freq: Every day | ORAL | 0 refills | Status: AC

## 2023-08-10 NOTE — Progress Notes (Signed)
E Visit for Rash  We are sorry that you are not feeling well. Here is how we plan to help!  From the pictures you are sharing this rash looks like pityriasis rosea.  This rash can take several weeks to resolve and is self limiting.    I can send in another short course of steroids which may help. Can apply Aquaphor.   HOME CARE:  Take cool showers and avoid direct sunlight. Apply cool compress or wet dressings. Take a bath in an oatmeal bath.  Sprinkle content of one Aveeno packet under running faucet with comfortably warm water.  Bathe for 15-20 minutes, 1-2 times daily.  Pat dry with a towel. Do not rub the rash. Use hydrocortisone cream. Take an antihistamine like Benadryl for widespread rashes that itch.  The adult dose of Benadryl is 25-50 mg by mouth 4 times daily. Caution:  This type of medication may cause sleepiness.  Do not drink alcohol, drive, or operate dangerous machinery while taking antihistamines.  Do not take these medications if you have prostate enlargement.  Read package instructions thoroughly on all medications that you take.  GET HELP RIGHT AWAY IF:  Symptoms don't go away after treatment. Severe itching that persists. If you rash spreads or swells. If you rash begins to smell. If it blisters and opens or develops a yellow-brown crust. You develop a fever. You have a sore throat. You become short of breath.  MAKE SURE YOU:  Understand these instructions. Will watch your condition. Will get help right away if you are not doing well or get worse.  Thank you for choosing an e-visit.  Your e-visit answers were reviewed by a board certified advanced clinical practitioner to complete your personal care plan. Depending upon the condition, your plan could have included both over the counter or prescription medications.  Please review your pharmacy choice. Make sure the pharmacy is open so you can pick up prescription now. If there is a problem, you may contact  your provider through [employer] and have the prescription routed to another pharmacy.  Your safety is important to [place]. If you have drug allergies check your prescription carefully.   For the next 24 hours you can use MyChart to ask questions about today's visit, request a non-urgent call back, or ask for a work or school excuse. You will get an email in the next two days asking about your experience. I hope that your e-visit has been valuable and will speed your recovery.  I have spent 5 minutes in review of e-visit questionnaire, review and updating patient chart, medical decision making and response to patient.   Tylene Fantasia Ward, PA-C
